# Patient Record
Sex: Male | Born: 1975 | Race: White | Hispanic: No | Marital: Single | Smoking: Current every day smoker
Health system: Southern US, Community
[De-identification: ages and names within clinical notes are randomized; demographics above are authoritative.]

## PROBLEM LIST (undated history)

## (undated) HISTORY — PX: HERNIA REPAIR: SHX51

---

## 2004-05-25 ENCOUNTER — Ambulatory Visit: Payer: Self-pay | Admitting: Surgery

## 2005-05-01 ENCOUNTER — Emergency Department: Payer: Self-pay | Admitting: Emergency Medicine

## 2021-04-26 ENCOUNTER — Encounter: Payer: Self-pay | Admitting: Emergency Medicine

## 2021-04-26 ENCOUNTER — Other Ambulatory Visit: Payer: Self-pay

## 2021-04-26 ENCOUNTER — Ambulatory Visit (INDEPENDENT_AMBULATORY_CARE_PROVIDER_SITE_OTHER): Payer: Worker's Compensation

## 2021-04-26 ENCOUNTER — Ambulatory Visit
Admission: EM | Admit: 2021-04-26 | Discharge: 2021-04-26 | Disposition: A | Discharge status: Home / Self Care | Payer: Worker's Compensation | Attending: Medical Oncology | Admitting: Medical Oncology

## 2021-04-26 DIAGNOSIS — M79641 Pain in right hand: Secondary | ICD-10-CM

## 2021-04-26 DIAGNOSIS — M25531 Pain in right wrist: Secondary | ICD-10-CM

## 2021-04-26 DIAGNOSIS — L03011 Cellulitis of right finger: Secondary | ICD-10-CM

## 2021-04-26 MED ORDER — DOXYCYCLINE HYCLATE 100 MG PO CAPS: 100.0000 mg | ORAL_CAPSULE | Freq: Two times a day (BID) | ORAL | 0 refills | Status: DC

## 2021-04-26 NOTE — ED Provider Notes (Signed)
[facility]    CSN: [id number] Arrival date & time: 04/26/21  1309      History   Chief Complaint Chief Complaint  Patient presents with   Worker's Comp Injury   Hand Pain    HPI LAKYN MANTIONE is a 46 y.o. male.   HPI  Hand Pain: Patient reports that on 04/15/2021 he got his right hand caught in a piece of machinery at work.  He states that he tore his glove off and since has had pain and swelling of his right hand.  He states that he reported this to his manager but has not been seen for this injury and had a medical capacity.  He states that the pain has improved but he now is having more localized pain in his right fourth finger.  He states that it is a bit swollen near the area where there is mild skin breakdown and he does have some difficulties with range of motion.  No fevers, history of MRSA.  He is up-to-date on his Tdap.  He has been trying over-the-counter pain medication for symptoms without much relief.  History reviewed. No pertinent past medical history.  There are no problems to display for this patient.   Past Surgical History:  Procedure Laterality Date   HERNIA REPAIR       Home Medications    Prior to Admission medications   Not on File    Family History History reviewed. No pertinent family history.  Social History Social History   Tobacco Use   Smoking status: Every Day    Types: Cigarettes   Smokeless tobacco: Never  Vaping Use   Vaping Use: Never used  Substance Use Topics   Alcohol use: Not Currently   Drug use: Never     Allergies   Patient has no known allergies.   Review of Systems Review of Systems  As stated above in HPI Physical Exam Triage Vital Signs ED Triage Vitals  Enc Vitals Group     BP 04/26/21 1325 (!) 144/99     Pulse Rate 04/26/21 1325 93     Resp 04/26/21 1325 15     Temp 04/26/21 1325 98.7 F (37.1 C)     Temp Source 04/26/21 1325 Oral     SpO2 04/26/21 1325 100 %     Weight 04/26/21  1322 160 lb (72.6 kg)     Height 04/26/21 1322 5\' 9"  (1.753 m)     Head Circumference --      Peak Flow --      Pain Score 04/26/21 1322 0     Pain Loc --      Pain Edu? --      Excl. in GC? --    No data found.  Updated Vital Signs BP (!) 144/99 (BP Location: Right Arm)    Pulse 93    Temp 98.7 F (37.1 C) (Oral)    Resp 15    Ht 5\' 9"  (1.753 m)    Wt 160 lb (72.6 kg)    SpO2 100%    BMI 23.63 kg/m    Physical Exam Vitals and nursing note reviewed.  Constitutional:      General: He is not in acute distress.    Appearance: Normal appearance. He is not ill-appearing, toxic-appearing or diaphoretic.  Cardiovascular:     Pulses: Normal pulses.  Musculoskeletal:     Comments: ROM of the right 4th digit is reduced by 25% to flexion. No abrupt  movements of finger during ROM exercises. There is mild to moderate edema of the distal finger with mild erythema with a glossy hue near the area of resolving skin breakdown of the right 4th nailbed. No trailing erythema.   Skin:    General: Skin is warm.  Neurological:     Mental Status: He is alert and oriented to person, place, and time.     UC Treatments / Results  Labs (all labs ordered are listed, but only abnormal results are displayed) Labs Reviewed - No data to display  EKG   Radiology DG Wrist Complete Right  Result Date: 04/26/2021 CLINICAL DATA:  Hand got caught in machine. Work injury on 04/15/2021 EXAM: RIGHT WRIST - COMPLETE 3+ VIEW COMPARISON:  None. FINDINGS: There is no evidence of fracture or dislocation. There is no evidence of arthropathy or other focal bone abnormality. Soft tissues are unremarkable. IMPRESSION: Negative. Electronically Signed   By: Kennith Center M.D.   On: 04/26/2021 13:59   DG Hand Complete Right  Result Date: 04/26/2021 CLINICAL DATA:  Work injury 04/15/2021 after hand got stuck in a machine. EXAM: RIGHT HAND - COMPLETE 3+ VIEW COMPARISON:  None. FINDINGS: There is no evidence of fracture or  dislocation. There is no evidence of arthropathy or other focal bone abnormality. Soft tissues are unremarkable. IMPRESSION: Negative. Electronically Signed   By: Kennith Center M.D.   On: 04/26/2021 13:58    Procedures Procedures (including critical care time)  Medications Ordered in UC Medications - No data to display  Initial Impression / Assessment and Plan / UC Course  I have reviewed the triage vital signs and the nursing notes.  Pertinent labs & imaging results that were available during my care of the patient were reviewed by me and considered in my medical decision making (see chart for details).     New.  I discussed with patient that typically he needs to have evaluation within 24 hours for Workmen's Comp. cases so he is to get a need to discuss this with his company.  His x-rays today are normal.  Likely has some starting paronychia from where he had some skin breakdown near his nailbed.  I going to cover with doxycycline which also has an anti-inflammatory property to it.  We discussed red flag signs and symptoms.  Follow-up with occupational health and or orthopedics if needed. Motrin or tylenol for pain.  Final Clinical Impressions(s) / UC Diagnoses   Final diagnoses:  None   Discharge Instructions   None    ED Prescriptions   None    PDMP not reviewed this encounter.   Rushie Chestnut, [place] 04/26/21 1425

## 2021-04-26 NOTE — ED Triage Notes (Signed)
Patient states that he injured his right hand on 04/15/21 when his right hand got caught in a machine at work.  Patient states that the machine tore his glove off and had pain and swelling in his right hand.  Patient states that he is still having pain in his right hand and wrist.

## 2022-03-09 ENCOUNTER — Emergency Department
Admission: EM | Admit: 2022-03-09 | Discharge: 2022-03-09 | Disposition: A | Discharge status: Home / Self Care | Payer: No Typology Code available for payment source | Attending: Emergency Medicine | Admitting: Emergency Medicine

## 2022-03-09 ENCOUNTER — Encounter: Payer: Self-pay | Admitting: Emergency Medicine

## 2022-03-09 ENCOUNTER — Other Ambulatory Visit: Payer: Self-pay

## 2022-03-09 ENCOUNTER — Emergency Department: Payer: No Typology Code available for payment source

## 2022-03-09 DIAGNOSIS — S6992XA Unspecified injury of left wrist, hand and finger(s), initial encounter: Secondary | ICD-10-CM | POA: Diagnosis present

## 2022-03-09 DIAGNOSIS — Z23 Encounter for immunization: Secondary | ICD-10-CM | POA: Insufficient documentation

## 2022-03-09 DIAGNOSIS — R600 Localized edema: Secondary | ICD-10-CM | POA: Diagnosis not present

## 2022-03-09 DIAGNOSIS — Y990 Civilian activity done for income or pay: Secondary | ICD-10-CM | POA: Insufficient documentation

## 2022-03-09 DIAGNOSIS — W3189XA Contact with other specified machinery, initial encounter: Secondary | ICD-10-CM | POA: Insufficient documentation

## 2022-03-09 DIAGNOSIS — S62623B Displaced fracture of medial phalanx of left middle finger, initial encounter for open fracture: Secondary | ICD-10-CM | POA: Insufficient documentation

## 2022-03-09 MED ORDER — HYDROCODONE-ACETAMINOPHEN 5-325 MG PO TABS: 1.0000 | ORAL_TABLET | ORAL | 0 refills | Status: DC | PRN

## 2022-03-09 MED ORDER — LIDOCAINE HCL (PF) 1 % IJ SOLN
20.0000 mL | Freq: Once | INTRAMUSCULAR | Status: AC
  Administered 2022-03-09: 20 mL via INTRADERMAL
  Filled 2022-03-09: qty 20

## 2022-03-09 MED ORDER — TETANUS-DIPHTH-ACELL PERTUSSIS 5-2.5-18.5 LF-MCG/0.5 IM SUSY
0.5000 mL | PREFILLED_SYRINGE | Freq: Once | INTRAMUSCULAR | Status: AC
  Administered 2022-03-09: 0.5 mL via INTRAMUSCULAR
  Filled 2022-03-09: qty 0.5

## 2022-03-09 MED ORDER — CEPHALEXIN 500 MG PO CAPS: 500.0000 mg | ORAL_CAPSULE | Freq: Four times a day (QID) | ORAL | 0 refills | Status: AC

## 2022-03-09 MED ORDER — CEPHALEXIN 500 MG PO CAPS
500.0000 mg | ORAL_CAPSULE | Freq: Once | ORAL | Status: AC
  Administered 2022-03-09: 500 mg via ORAL
  Filled 2022-03-09: qty 1

## 2022-03-09 NOTE — ED Triage Notes (Signed)
Pt here after a left middle finger laceration. 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 Pt left hand wrapped in washcloth. Pt ambulatory to triage.

## 2022-03-09 NOTE — ED Notes (Signed)
Xeroform dressing placed and finger buddy taped per MD verbal order.

## 2022-03-09 NOTE — ED Provider Triage Note (Signed)
Emergency Medicine Provider Triage Evaluation Note  SHED NIXON , a 46 y.o. male  was evaluated in triage.  Pt complains of left middle finger pain.  Patient was at work when a press came down on his finger.  Patient states that this breast weighs about 1000 pounds and spins as it comes down.  Unsure of last Tdap..  Review of Systems  Positive:  Negative:   Physical Exam  Ht 5\' 9"  (1.753 m)   Wt 72.6 kg   BMI 23.64 kg/m  Gen:   Awake, no distress   Resp:  Normal effort  MSK:   Left middle finger with crush injury and open wounds, active bleeding noted Other:    Medical Decision Making  Medically screening exam initiated at 7:29 AM.  Appropriate orders placed.  Duward Allbritton Harper was informed that the remainder of the evaluation will be completed by another provider, this initial triage assessment does not replace that evaluation, and the importance of remaining in the ED until their evaluation is complete.  X-ray, Tdap ordered, dressing applied in triage   Jenne Campus, PA-C 03/09/22 0730

## 2022-03-09 NOTE — ED Notes (Signed)
Patient Alert and oriented to baseline. Stable and ambulatory to baseline. Patient verbalized understanding of the discharge instructions.  Patient belongings were taken by the patient.   

## 2022-03-09 NOTE — ED Provider Notes (Signed)
[facility] Provider Note    Event Date/Time   First MD Initiated Contact with Patient 03/09/22 0732     (approximate)   History   Chief Complaint Finger Injury   HPI  Andre Murillo is a 46 y.o. male with no significant past medical history who presents to the ED complaining of finger injury.  Patient reports that just prior to arrival he was switching on a refitting machine when it slammed down on his left middle finger.  It seemed to crush the distal part of his finger, but he does not think any parts were torn off.  He has been able to control bleeding with pressure since then.  He is unsure of his last tetanus shot.      Physical Exam   Triage Vital Signs: ED Triage Vitals  Enc Vitals Group     BP 03/09/22 0729 (!) 118/102     Pulse Rate 03/09/22 0729 79     Resp 03/09/22 0729 20     Temp 03/09/22 0729 98 F (36.7 C)     Temp Source 03/09/22 0729 Oral     SpO2 03/09/22 0729 97 %     Weight 03/09/22 0719 160 lb 0.9 oz (72.6 kg)     Height 03/09/22 0719 5\' 9"  (1.753 m)     Head Circumference --      Peak Flow --      Pain Score 03/09/22 0719 3     Pain Loc --      Pain Edu? --      Excl. in GC? --     Most recent vital signs: Vitals:   03/09/22 0729  BP: (!) 118/102  Pulse: 79  Resp: 20  Temp: 98 F (36.7 C)  SpO2: 97%    Constitutional: Alert and oriented. Eyes: Conjunctivae are normal. Head: Atraumatic. Nose: No congestion/rhinnorhea. Mouth/Throat: Mucous membranes are moist.  Cardiovascular: Normal rate, regular rhythm. Grossly normal heart sounds.  2+ radial pulses bilaterally. Respiratory: Normal respiratory effort.  No retractions. Lungs CTAB. Gastrointestinal: Soft and nontender. No distention. Musculoskeletal: Crush injury noted to distal left middle finger with laceration wrapping around to the medial portion of the finger and across the top of his finger just proximal to the nailbed.  Associated edema noted with no  active bleeding.  Able to flex slightly at the DIP, able to flex normally at the PIP.  No lower extremity tenderness nor edema.  Neurologic:  Normal speech and language. No gross focal neurologic deficits are appreciated.    ED Results / Procedures / Treatments   Labs (all labs ordered are listed, but only abnormal results are displayed) Labs Reviewed - No data to display  RADIOLOGY Left hand x-ray reviewed and interpreted by me with comminuted fractures of middle and distal phalanx of left middle finger, no foreign body noted.  PROCEDURES:  Critical Care performed: No  ..Laceration Repair  Date/Time: 03/09/2022 9:13 AM  Performed by: 14/08/2021, MD Authorized by: Chesley Noon, MD   Consent:    Consent obtained:  Verbal   Consent given by:  Patient   Risks, benefits, and alternatives were discussed: yes     Risks discussed:  Pain, infection, retained foreign body, tendon damage, vascular damage, poor wound healing, poor cosmetic result, need for additional repair and nerve damage Universal protocol:    Patient identity confirmed:  Verbally with patient and arm band Anesthesia:    Anesthesia method:  Nerve block   Block  needle gauge:  24 G   Block anesthetic:  Lidocaine 1% w/o epi   Block technique:  Digital   Block injection procedure:  Anatomic landmarks identified, introduced needle, incremental injection, negative aspiration for blood and anatomic landmarks palpated   Block outcome:  Anesthesia achieved Laceration details:    Location:  Finger   Finger location:  L long finger   Length (cm):  7 Pre-procedure details:    Preparation:  Patient was prepped and draped in usual sterile fashion and imaging obtained to evaluate for foreign bodies Exploration:    Limited defect created (wound extended): no     Hemostasis achieved with:  Direct pressure   Imaging obtained: x-ray     Imaging outcome: foreign body not noted     Wound exploration: wound explored through  full range of motion and entire depth of wound visualized     Wound extent: underlying fracture     Wound extent: areolar tissue not violated, no foreign body, no nerve damage, no tendon damage and no vascular damage     Contaminated: yes   Treatment:    Area cleansed with:  Saline   Amount of cleaning:  Extensive   Irrigation solution:  Sterile saline   Irrigation volume:  1000 mL   Irrigation method:  Pressure wash   Visualized foreign bodies/material removed: no     Debridement:  None   Undermining:  None   Scar revision: no   Skin repair:    Repair method:  Sutures   Suture size:  5-0   Suture material:  Nylon   Suture technique:  Simple interrupted   Number of sutures:  8 Approximation:    Approximation:  Loose Repair type:    Repair type:  Intermediate Post-procedure details:    Dressing:  Non-adherent dressing, splint for protection and bulky dressing   Procedure completion:  Tolerated well, no immediate complications    MEDICATIONS ORDERED IN ED: Medications  Tdap (BOOSTRIX) injection 0.5 mL (0.5 mLs Intramuscular Given 03/09/22 0745)  lidocaine (PF) (XYLOCAINE) 1 % injection 20 mL (20 mLs Intradermal Given 03/09/22 0748)  cephALEXin (KEFLEX) capsule 500 mg (500 mg Oral Given 03/09/22 0926)     IMPRESSION / MDM / ASSESSMENT AND PLAN / ED COURSE  I reviewed the triage vital signs and the nursing notes.                              46 y.o. male with no significant past medical history who presents to the ED complaining of crush injury to his distal left middle finger at work while using a machine.  Patient's presentation is most consistent with acute complicated illness / injury requiring diagnostic workup.  Differential diagnosis includes, but is not limited to, fracture, dislocation, open fracture, laceration, foreign body, tendon injury, vascular injury.  Patient arrives with apparent crush injury to distal left middle finger with significant irregular  laceration along the medial portion of the finger extending across just proximal to the nailbed.  Nailbed appears intact and patient is able to range the DIP slightly, no obvious tendon or vascular injury noted.  X-ray shows comminuted fracture of the middle and distal phalanges, consistent with open fracture, but no foreign body noted.  Wound was copiously irrigated with saline and laceration loosely approximated.  We will start patient on Keflex, wound was dressed and finger placed in splint.  Case discussed with Dr. Odis Luster of orthopedic surgery, who  agrees with plan for outpatient follow-up with hand specialist.  Patient counseled to return to the ED for new or worsening symptoms, patient agrees with plan.      FINAL CLINICAL IMPRESSION(S) / ED DIAGNOSES   Final diagnoses:  Open displaced fracture of middle phalanx of left middle finger, initial encounter     Rx / DC Orders   ED Discharge Orders          Ordered    cephALEXin (KEFLEX) 500 MG capsule  4 times daily        03/09/22 0927    HYDROcodone-acetaminophen (NORCO/VICODIN) 5-325 MG tablet  Every 4 hours PRN        03/09/22 1610             Note:  This document was prepared using Dragon voice recognition software and may include unintentional dictation errors.   Chesley Noon, MD 03/09/22 [phone]

## 2023-01-31 ENCOUNTER — Ambulatory Visit
Admission: EM | Admit: 2023-01-31 | Discharge: 2023-01-31 | Disposition: A | Discharge status: Home / Self Care | Payer: BC Managed Care – PPO | Attending: Emergency Medicine | Admitting: Emergency Medicine

## 2023-01-31 DIAGNOSIS — S025XXB Fracture of tooth (traumatic), initial encounter for open fracture: Secondary | ICD-10-CM

## 2023-01-31 DIAGNOSIS — K047 Periapical abscess without sinus: Secondary | ICD-10-CM

## 2023-01-31 MED ORDER — AMOXICILLIN-POT CLAVULANATE 875-125 MG PO TABS: 1.0000 | ORAL_TABLET | Freq: Two times a day (BID) | ORAL | 0 refills | Status: AC

## 2023-01-31 NOTE — Discharge Instructions (Addendum)
Take the Augmentin twice daily with food for 10 days for treatment of your dental infection.  Use over-the-counter Tylenol and ibuprofen for swelling and mild to moderate pain.  Rinse with warm salt water, or Listerine, after each meal to remove food particles and wash away any pus that is collecting.  If you develop any increasing or swelling, fever, pain, or difficulty swallowing you to go to the emergency department at [facility] with a have an oral surgeon and also a dentist on-call.

## 2023-01-31 NOTE — ED Provider Notes (Signed)
[facility]    CSN: [id number] Arrival date & time: 01/31/23  1134      History   Chief Complaint Chief Complaint  Patient presents with   Dental Pain    HPI Andre Murillo is a 47 y.o. male.   HPI  47 year old male with no significant past medical history presents for evaluation of left-sided facial swelling with dental pain with no significant past medical history presents for evaluation of left-sided facial swelling with dental pain x 3 days.  He has been using Tylenol at home with out any relief.  He came in today because he has been developing increasing facial swelling and he became concerned.  He does have a dentist but he has not contacted them yet for an appointment.  History reviewed. No pertinent past medical history.  There are no problems to display for this patient.   Past Surgical History:  Procedure Laterality Date   HERNIA REPAIR         Home Medications    Prior to Admission medications   Medication Sig Start Date End Date Taking? Authorizing Provider  amoxicillin-clavulanate (AUGMENTIN) 875-125 MG tablet Take 1 tablet by mouth every 12 (twelve) hours for 10 days. 01/31/23 02/10/23 Yes Becky Augusta, NP    Family History History reviewed. No pertinent family history.  Social History Social History   Tobacco Use   Smoking status: Every Day    Types: Cigarettes   Smokeless tobacco: Never  Vaping Use   Vaping status: Never Used  Substance Use Topics   Alcohol use: Not Currently   Drug use: Never     Allergies   Patient has no known allergies.   Review of Systems Review of Systems  Constitutional:  Negative for fever.  HENT:  Positive for dental problem and facial swelling. Negative for trouble swallowing.      Physical Exam Triage Vital Signs ED Triage Vitals  Encounter Vitals Group     BP 01/31/23 1151 127/89     Systolic BP Percentile --      Diastolic BP Percentile --      Pulse Rate 01/31/23 1151 78     Resp  01/31/23 1151 16     Temp 01/31/23 1151 98.3 F (36.8 C)     Temp Source 01/31/23 1151 Oral     SpO2 01/31/23 1151 96 %     Weight 01/31/23 1151 170 lb (77.1 kg)     Height 01/31/23 1151 5\' 9"  (1.753 m)     Head Circumference --      Peak Flow --      Pain Score 01/31/23 1155 4     Pain Loc --      Pain Education --      Exclude from Growth Chart --    No data found.  Updated Vital Signs BP 127/89 (BP Location: Left Arm)   Pulse 78   Temp 98.3 F (36.8 C) (Oral)   Resp 16   Ht 5\' 9"  (1.753 m)   Wt 170 lb (77.1 kg)   SpO2 96%   BMI 25.10 kg/m   Visual Acuity Right Eye Distance:   Left Eye Distance:   Bilateral Distance:    Right Eye Near:   Left Eye Near:    Bilateral Near:     Physical Exam Vitals and nursing note reviewed.  Constitutional:      Appearance: Normal appearance. He is not ill-appearing.  HENT:     Head: Normocephalic and atraumatic.  Mouth/Throat:     Mouth: Mucous membranes are moist.     Pharynx: Oropharynx is clear. Posterior oropharyngeal erythema present. No oropharyngeal exudate.     Comments: Posterior aspect of patient's first molar on the lower left is fractured.  The second and third molars are broken off at the gumline.  Surrounding gum tissue is erythematous.  No appreciable discharge.  Patient does have palpable, and visible, external swelling adjacent to his first molar on the left-hand side of the mandible.  No submandibular or submental fullness.  Airway is patent. Skin:    General: Skin is warm and dry.     Capillary Refill: Capillary refill takes less than 2 seconds.  Neurological:     General: No focal deficit present.     Mental Status: He is alert and oriented to person, place, and time.      UC Treatments / Results  Labs (all labs ordered are listed, but only abnormal results are displayed) Labs Reviewed - No data to display  EKG   Radiology No results found.  Procedures Procedures (including critical care  time)  Medications Ordered in UC Medications - No data to display  Initial Impression / Assessment and Plan / UC Course  I have reviewed the triage vital signs and the nursing notes.  Pertinent labs & imaging results that were available during my care of the patient were reviewed by me and considered in my medical decision making (see chart for details).   Patient is a nontoxic-appearing 47 year old male presenting for evaluation of dental pain and facial swelling as outlined HPI above.  Patient is able to speak in full sentence without dyspnea or tachypnea and his airway is patent.  He does have multiple broken teeth on the left side of his mandible and his first molar has a fracture to the posterior aspect.  This tooth is tender to percussion.  I will treat the patient for dental abscess with Augmentin 875 twice daily for 10 days.  He should use salt water gargles and rinses following meals to wash away any drainage as well as to move any food particles and prevent them from getting trapped and compounding the infection.  He can use over-the-counter Tylenol and or ibuprofen to help with pain.  I have instructed him to contact his dentist upon leaving to schedule an appointment.   Final Clinical Impressions(s) / UC Diagnoses   Final diagnoses:  Dental abscess  Open fracture of tooth, initial encounter     Discharge Instructions      Take the Augmentin twice daily with food for 10 days for treatment of your dental infection.  Use over-the-counter Tylenol and ibuprofen for swelling and mild to moderate pain.  Rinse with warm salt water, or Listerine, after each meal to remove food particles and wash away any pus that is collecting.  If you develop any increasing or swelling, fever, pain, or difficulty swallowing you to go to the emergency department at [facility] with a have an oral surgeon and also a dentist on-call.      ED Prescriptions     Medication Sig Dispense Auth.  Provider   amoxicillin-clavulanate (AUGMENTIN) 875-125 MG tablet Take 1 tablet by mouth every 12 (twelve) hours for 10 days. 20 tablet Becky Augusta, NP      PDMP not reviewed this encounter.   Becky Augusta, NP 01/31/23 1213

## 2023-01-31 NOTE — ED Triage Notes (Signed)
Pt c/o dental pain & L sided facial swelling x3 days. Has tried tylenol w/o relief.

## 2023-02-15 ENCOUNTER — Encounter: Payer: Self-pay | Admitting: Emergency Medicine

## 2023-02-15 ENCOUNTER — Ambulatory Visit
Admission: EM | Admit: 2023-02-15 | Discharge: 2023-02-15 | Disposition: A | Discharge status: Home / Self Care | Payer: BC Managed Care – PPO | Attending: Emergency Medicine | Admitting: Emergency Medicine

## 2023-02-15 DIAGNOSIS — R030 Elevated blood-pressure reading, without diagnosis of hypertension: Secondary | ICD-10-CM | POA: Diagnosis not present

## 2023-02-15 DIAGNOSIS — K047 Periapical abscess without sinus: Secondary | ICD-10-CM | POA: Diagnosis not present

## 2023-02-15 DIAGNOSIS — F17200 Nicotine dependence, unspecified, uncomplicated: Secondary | ICD-10-CM

## 2023-02-15 MED ORDER — AMOXICILLIN-POT CLAVULANATE 875-125 MG PO TABS: 1.0000 | ORAL_TABLET | Freq: Two times a day (BID) | ORAL | 0 refills | Status: AC

## 2023-02-15 NOTE — Discharge Instructions (Addendum)
-  Stop smoking -Please get labs with PCP for further evaluation of general medical issues, recheck blood pressure.  -Please follow-up with dental provider of your choice regarding fractured tooth/dental infection -Take antibiotic as directed, drink plenty of water. -If you have new or worsening symptoms go to the emergency room for further evaluation

## 2023-02-15 NOTE — ED Triage Notes (Signed)
Pt presents with sinus pressure, headache, cough, bodyaches and congestion x 3 weeks. Pt has tried OTC cold medication with no relief.

## 2023-02-15 NOTE — ED Provider Notes (Signed)
[facility]    CSN: [id number] Arrival date & time: 02/15/23  1000      History   Chief Complaint Chief Complaint  Patient presents with   Headache   Cough   Nasal Congestion   Generalized Body Aches    HPI Andre Murillo is a 47 y.o. male.   47 year old male pt, Andre Murillo, presents to urgent care for evaluation of head pressure and bodyaches for 21 days.  Patient was seen in urgent care recently for dental abscess and was scripted Augmentin x 10 days(10/28) completed last week.  States he does not have a Education officer, community or PCP.  The history is provided by the patient. No language interpreter was used.    History reviewed. No pertinent past medical history.  Patient Active Problem List   Diagnosis Date Noted   Dental infection 02/15/2023   Smoker 02/15/2023   Elevated blood pressure reading 02/15/2023    Past Surgical History:  Procedure Laterality Date   HERNIA REPAIR         Home Medications    Prior to Admission medications   Medication Sig Start Date End Date Taking? Authorizing Provider  amoxicillin-clavulanate (AUGMENTIN) 875-125 MG tablet Take 1 tablet by mouth every 12 (twelve) hours. 02/15/23  Yes Shermaine Brigham, Para March, NP    Family History History reviewed. No pertinent family history.  Social History Social History   Tobacco Use   Smoking status: Every Day    Types: Cigarettes   Smokeless tobacco: Never  Vaping Use   Vaping status: Never Used  Substance Use Topics   Alcohol use: Not Currently   Drug use: Never     Allergies   Patient has no known allergies.   Review of Systems Review of Systems  Constitutional:  Negative for fever.  HENT:  Positive for congestion and dental problem.   Respiratory:  Positive for cough.   Musculoskeletal:  Positive for myalgias.  Neurological:  Positive for headaches.  All other systems reviewed and are negative.    Physical Exam Triage Vital Signs ED Triage Vitals  Encounter Vitals  Group     BP      Systolic BP Percentile      Diastolic BP Percentile      Pulse      Resp      Temp      Temp src      SpO2      Weight      Height      Head Circumference      Peak Flow      Pain Score      Pain Loc      Pain Education      Exclude from Growth Chart    No data found.  Updated Vital Signs BP (!) 131/102 (BP Location: Left Arm)   Pulse 75   Temp 98 F (36.7 C) (Oral)   Resp 16   SpO2 95%   Visual Acuity Right Eye Distance:   Left Eye Distance:   Bilateral Distance:    Right Eye Near:   Left Eye Near:    Bilateral Near:     Physical Exam Vitals and nursing note reviewed.  Constitutional:      General: He is not in acute distress.    Appearance: He is well-developed and well-groomed.  HENT:     Head: Normocephalic and atraumatic.     Right Ear: External ear normal. Tympanic membrane is retracted.  Left Ear: External ear normal. Tympanic membrane is retracted.     Nose: Mucosal edema and congestion present.     Right Sinus: No maxillary sinus tenderness or frontal sinus tenderness.     Left Sinus: No maxillary sinus tenderness or frontal sinus tenderness.     Mouth/Throat:     Dentition: Abnormal dentition. Dental caries present.      Comments: Left side dental pain Eyes:     Conjunctiva/sclera: Conjunctivae normal.  Cardiovascular:     Rate and Rhythm: Normal rate and regular rhythm.     Pulses: Normal pulses.     Heart sounds: Normal heart sounds. No murmur heard. Pulmonary:     Effort: Pulmonary effort is normal. No respiratory distress.     Breath sounds: Normal breath sounds and air entry.  Abdominal:     Palpations: Abdomen is soft.     Tenderness: There is no abdominal tenderness.  Musculoskeletal:        General: No swelling.     Cervical back: Neck supple.  Skin:    General: Skin is warm and dry.     Capillary Refill: Capillary refill takes less than 2 seconds.  Neurological:     General: No focal deficit present.      Mental Status: He is alert and oriented to person, place, and time.     GCS: GCS eye subscore is 4. GCS verbal subscore is 5. GCS motor subscore is 6.     Cranial Nerves: No cranial nerve deficit.     Sensory: No sensory deficit.  Psychiatric:        Attention and Perception: Attention normal.        Mood and Affect: Mood normal.        Speech: Speech normal.        Behavior: Behavior normal. Behavior is cooperative.      UC Treatments / Results  Labs (all labs ordered are listed, but only abnormal results are displayed) Labs Reviewed - No data to display  EKG   Radiology No results found.  Procedures Procedures (including critical care time)  Medications Ordered in UC Medications - No data to display  Initial Impression / Assessment and Plan / UC Course  I have reviewed the triage vital signs and the nursing notes.  Pertinent labs & imaging results that were available during my care of the patient were reviewed by me and considered in my medical decision making (see chart for details).    Discussed exam findings and plan of care with patient, strict go to ER precautions given.   Patient verbalized understanding to this provider.  Ddx: Dental infection, smoker , elevated blood pressure reading Final Clinical Impressions(s) / UC Diagnoses   Final diagnoses:  Dental infection  Elevated blood pressure reading  Smoker     Discharge Instructions      -Stop smoking -Please get labs with PCP for further evaluation of general medical issues, recheck blood pressure.  -Please follow-up with dental provider of your choice regarding fractured tooth/dental infection -Take antibiotic as directed, drink plenty of water. -If you have new or worsening symptoms go to the emergency room for further evaluation     ED Prescriptions     Medication Sig Dispense Auth. Provider   amoxicillin-clavulanate (AUGMENTIN) 875-125 MG tablet Take 1 tablet by mouth every 12 (twelve)  hours. 14 tablet Gwenetta Devos, Para March, NP      PDMP not reviewed this encounter.   Clancy Gourd, NP 02/15/23 2025

## 2023-03-28 IMAGING — CR DG WRIST COMPLETE 3+V*R*
4 series · 4 of 4 positions shown · non-contrast
Comparison: None.

CLINICAL DATA: Hand got caught in machine. Work injury on
04/15/2021

EXAM:
RIGHT WRIST - COMPLETE 3+ VIEW

[wrist pa]
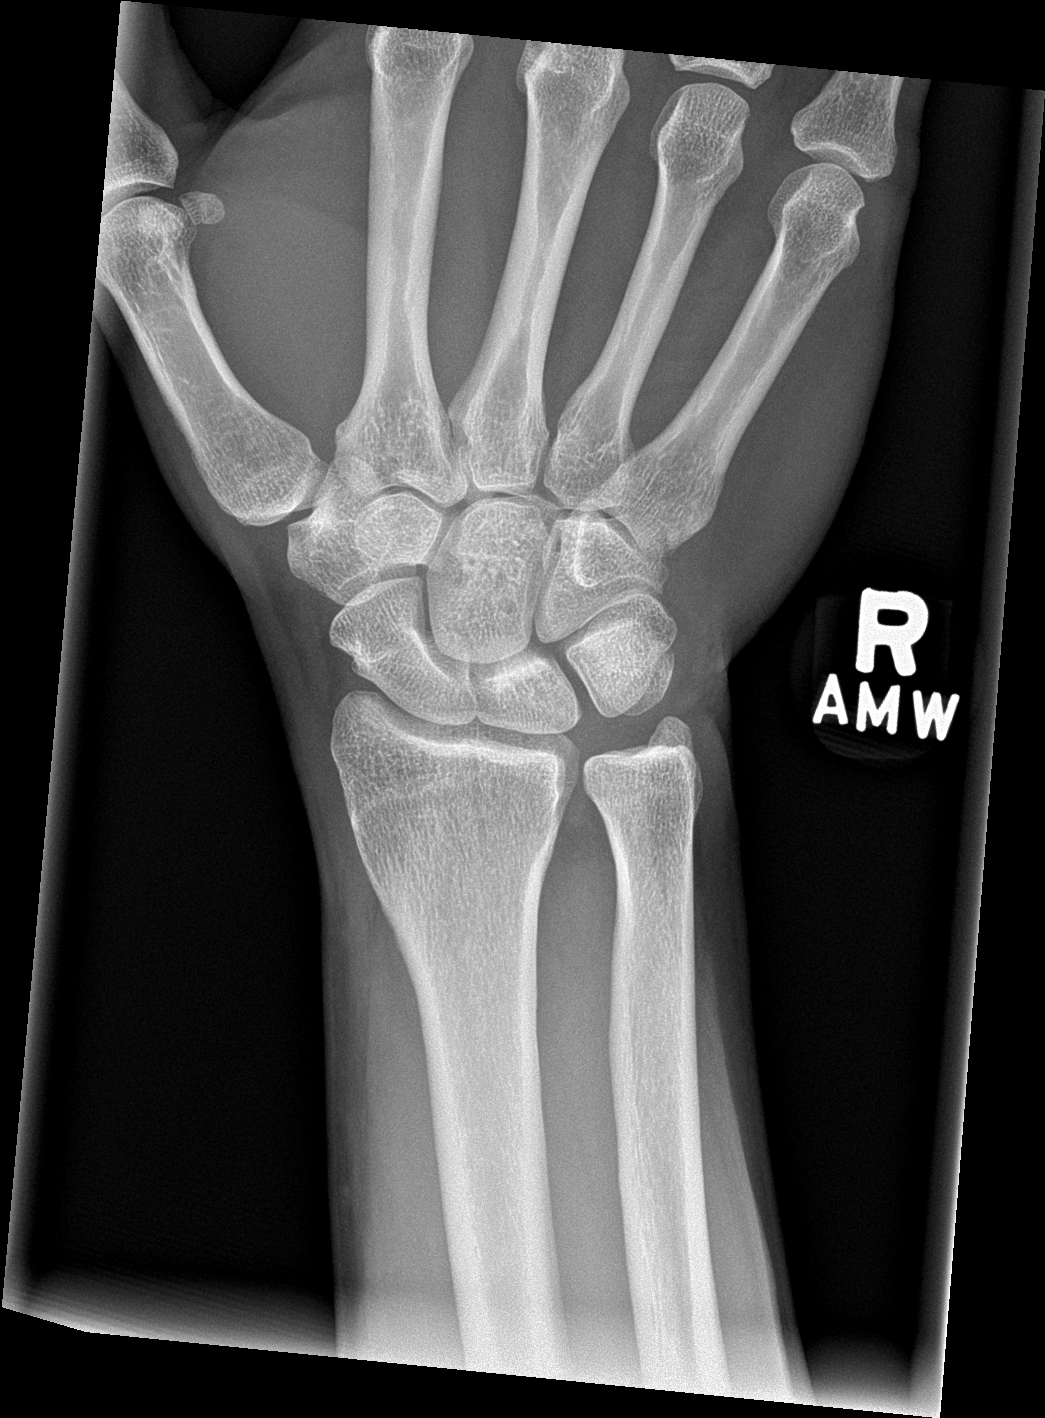

[wrist obl]
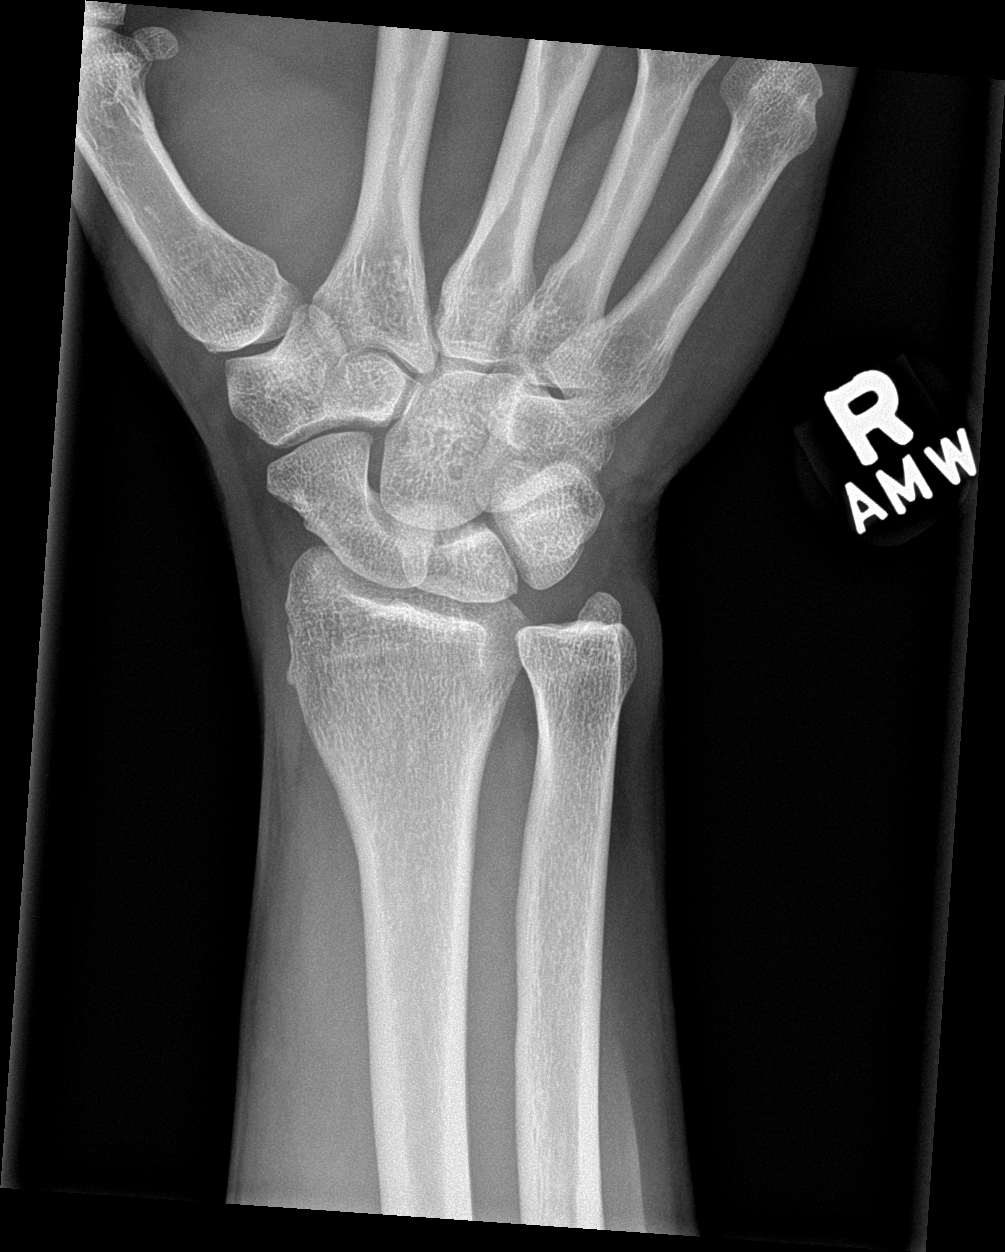

[wrist lat]
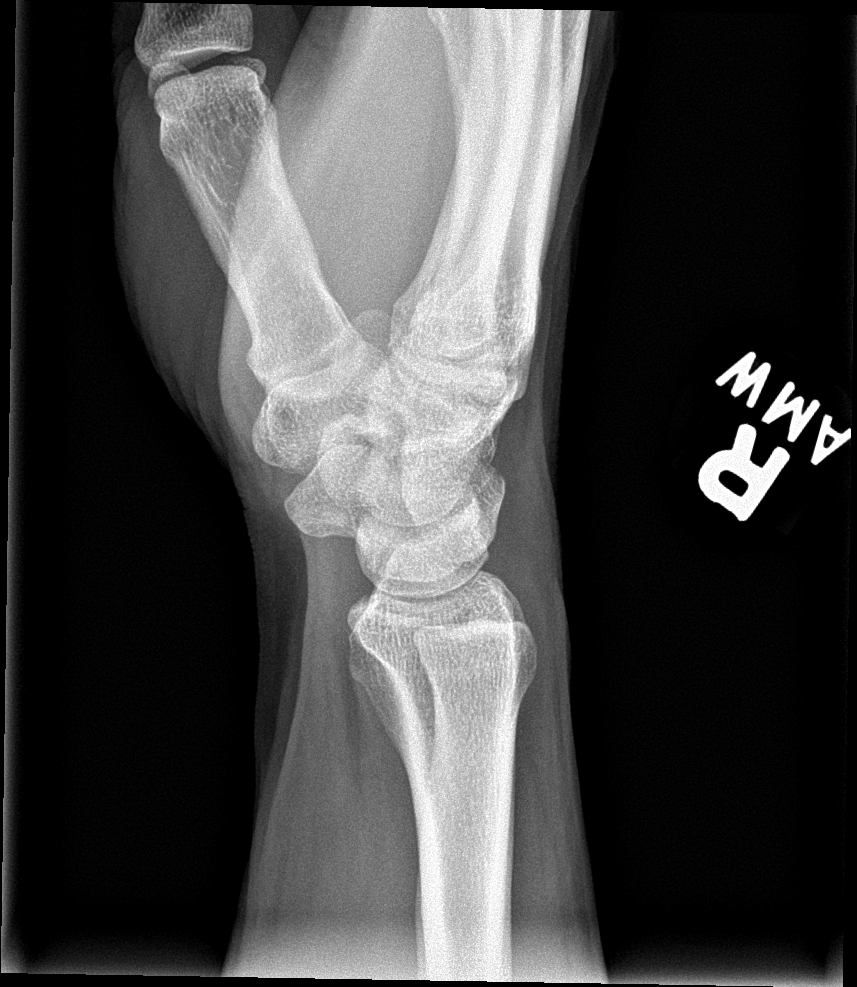

[wrist navicular]
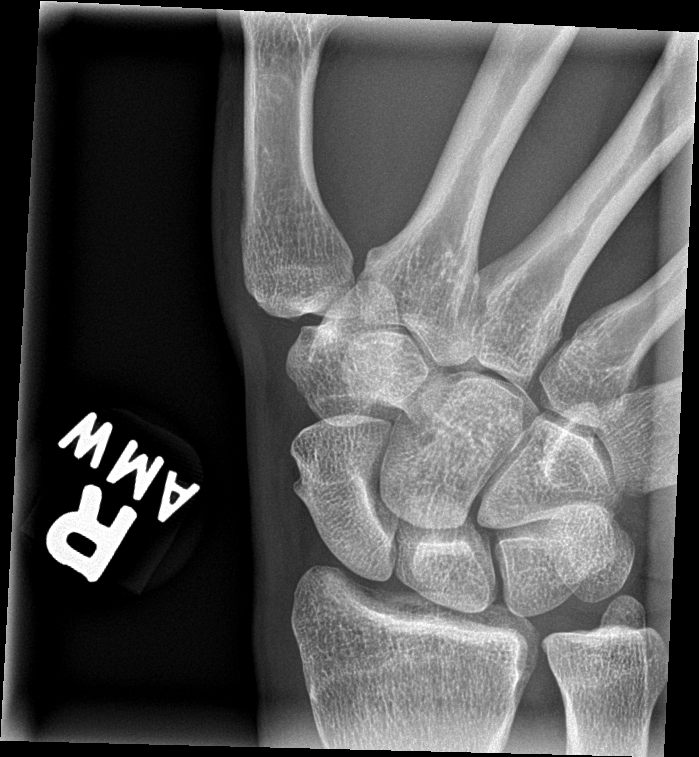

[4 of 4 positions shown; findings below may reference images not displayed]

FINDINGS: There is no evidence of fracture or dislocation. There is no
evidence of arthropathy or other focal bone abnormality. Soft
tissues are unremarkable.
IMPRESSION: Negative.

## 2023-03-28 IMAGING — CR DG HAND COMPLETE 3+V*R*
3 series · 3 of 3 positions shown · non-contrast
Comparison: None.

CLINICAL DATA: Work injury 04/15/2021 after hand got stuck in a
machine.

EXAM:
RIGHT HAND - COMPLETE 3+ VIEW

[hand ap]
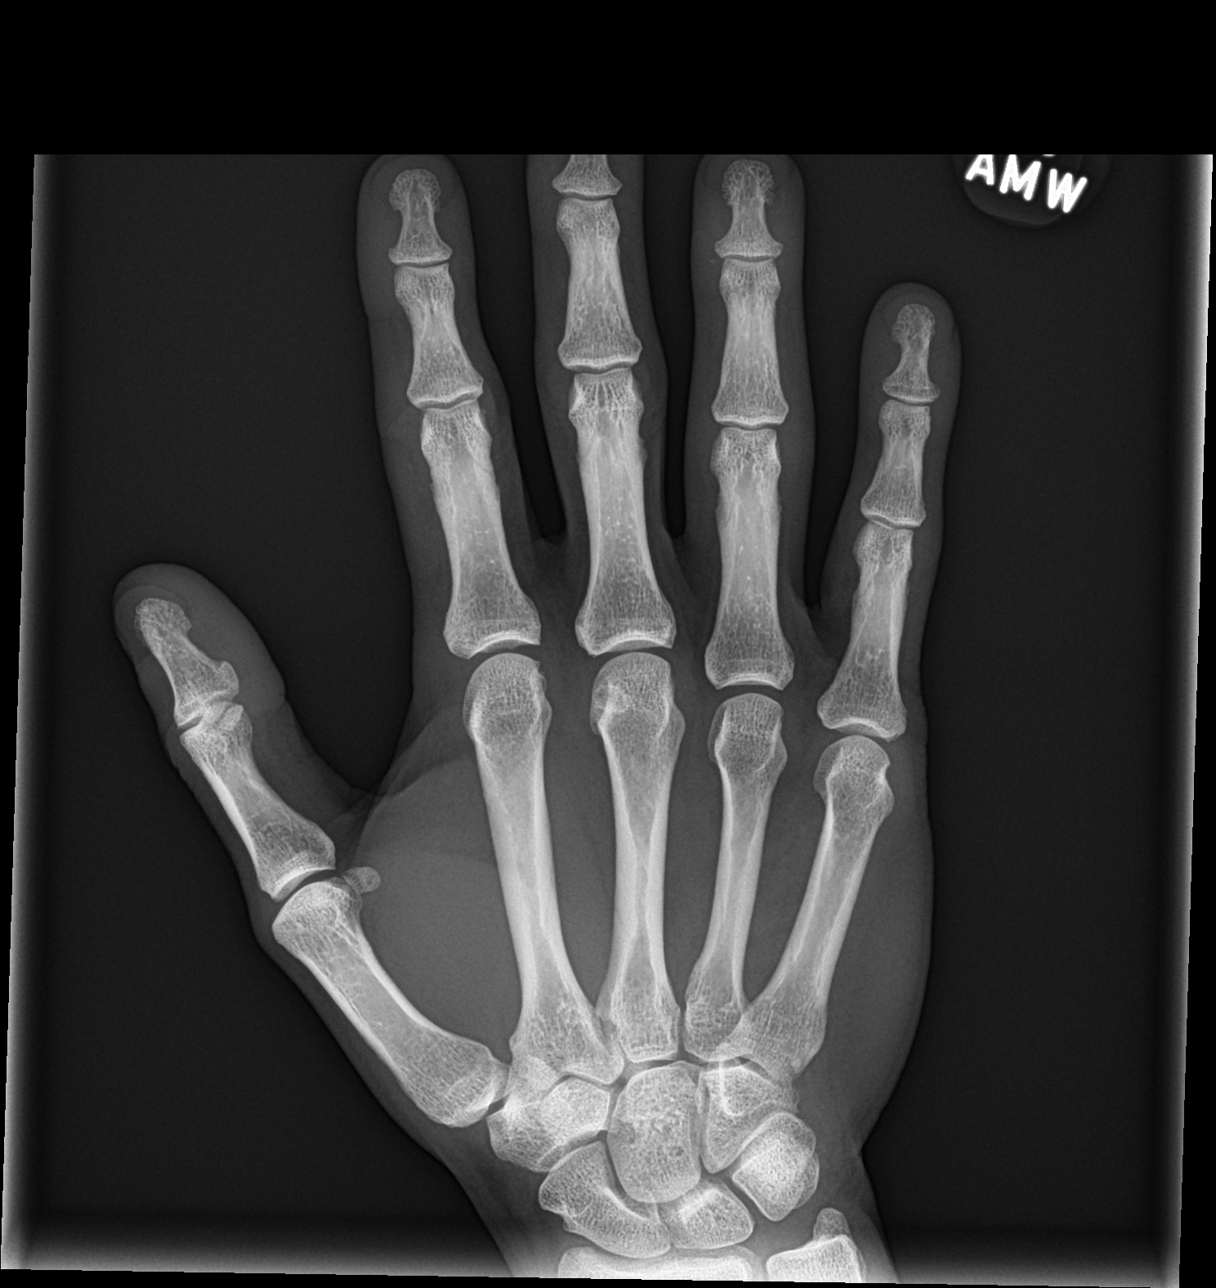

[hand obl]
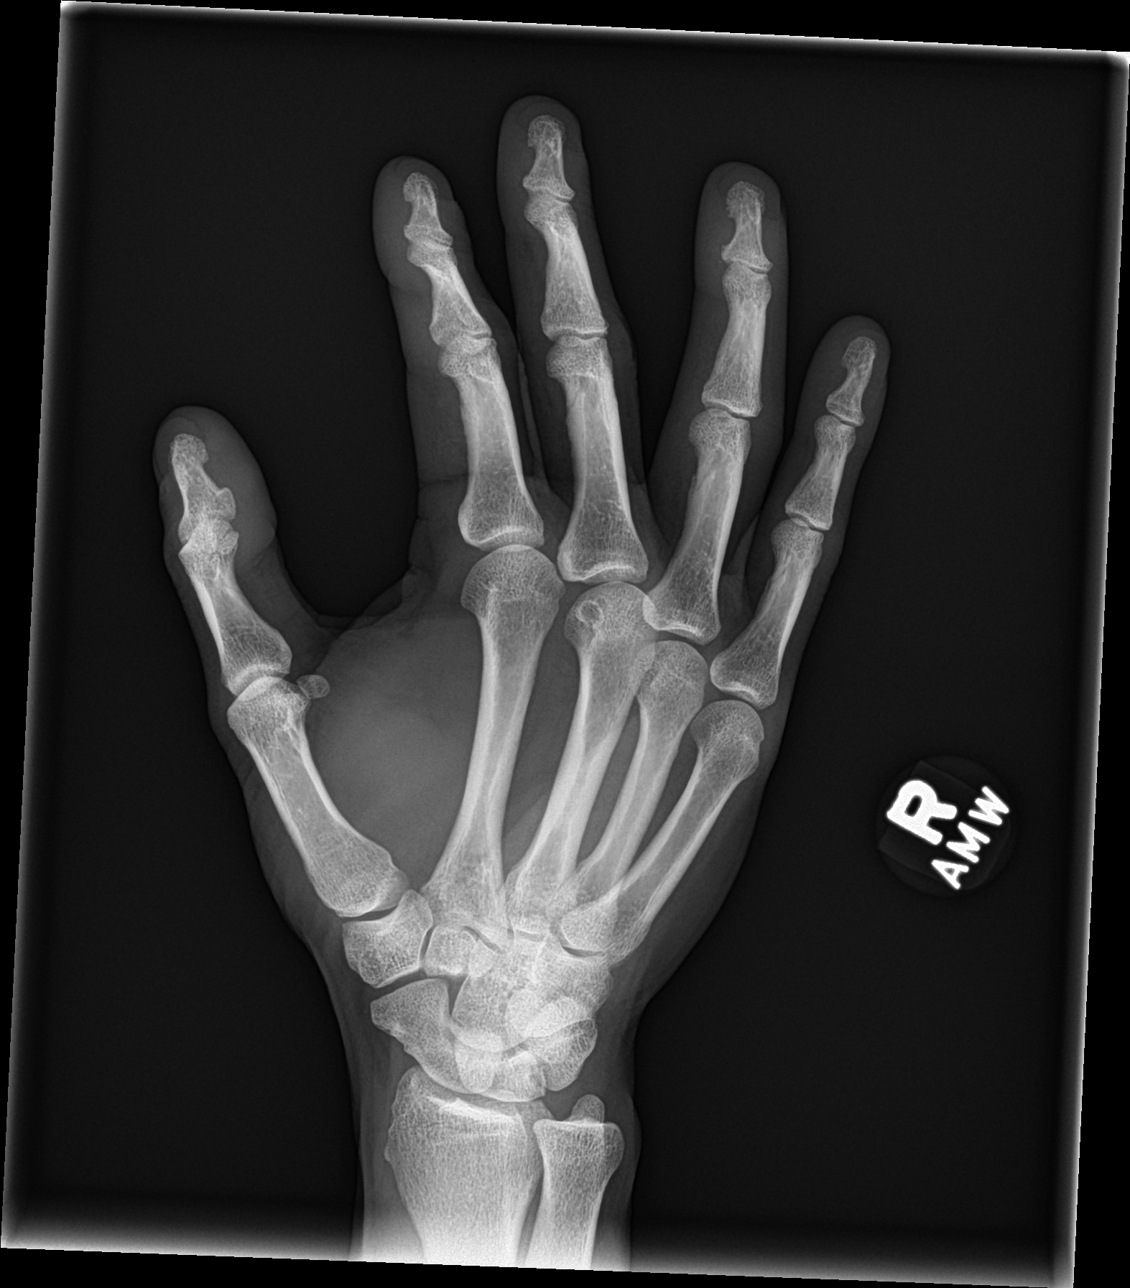

[hand lat]
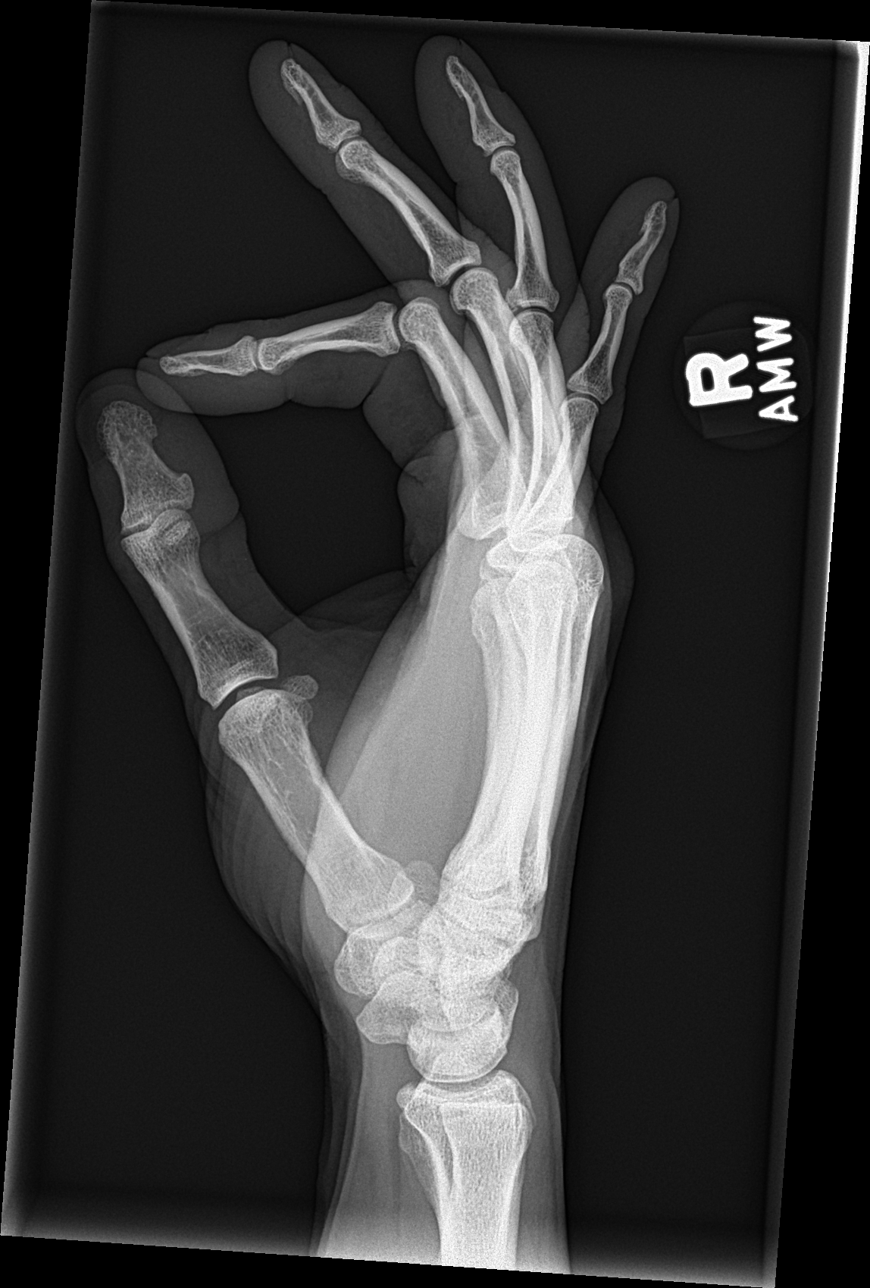

[3 of 3 positions shown; findings below may reference images not displayed]

FINDINGS: There is no evidence of fracture or dislocation. There is no
evidence of arthropathy or other focal bone abnormality. Soft
tissues are unremarkable.
IMPRESSION: Negative.
